# Patient Record
Sex: Male | Born: 1965 | Race: Asian | Hispanic: No | Marital: Single | Smoking: Never smoker
Health system: Southern US, Community
[De-identification: ages and names within clinical notes are randomized; demographics above are authoritative.]

## PROBLEM LIST (undated history)

## (undated) DIAGNOSIS — I10 Essential (primary) hypertension: Secondary | ICD-10-CM

---

## 2016-09-11 DIAGNOSIS — R600 Localized edema: Secondary | ICD-10-CM | POA: Insufficient documentation

## 2016-09-11 DIAGNOSIS — I10 Essential (primary) hypertension: Secondary | ICD-10-CM | POA: Insufficient documentation

## 2016-12-30 DIAGNOSIS — K5909 Other constipation: Secondary | ICD-10-CM | POA: Insufficient documentation

## 2016-12-30 DIAGNOSIS — R109 Unspecified abdominal pain: Secondary | ICD-10-CM | POA: Insufficient documentation

## 2016-12-30 DIAGNOSIS — R140 Abdominal distension (gaseous): Secondary | ICD-10-CM | POA: Insufficient documentation

## 2017-01-28 DIAGNOSIS — F630 Pathological gambling: Secondary | ICD-10-CM | POA: Insufficient documentation

## 2017-01-28 DIAGNOSIS — F331 Major depressive disorder, recurrent, moderate: Secondary | ICD-10-CM | POA: Insufficient documentation

## 2018-12-21 ENCOUNTER — Emergency Department (INDEPENDENT_AMBULATORY_CARE_PROVIDER_SITE_OTHER): Payer: Self-pay

## 2018-12-21 ENCOUNTER — Other Ambulatory Visit: Payer: Self-pay

## 2018-12-21 ENCOUNTER — Encounter: Payer: Self-pay | Admitting: *Deleted

## 2018-12-21 ENCOUNTER — Emergency Department
Admission: EM | Admit: 2018-12-21 | Discharge: 2018-12-21 | Disposition: A | Discharge status: Home / Self Care | Payer: Self-pay | Source: Home / Self Care | Attending: Family Medicine | Admitting: Family Medicine

## 2018-12-21 DIAGNOSIS — W19XXXA Unspecified fall, initial encounter: Secondary | ICD-10-CM

## 2018-12-21 DIAGNOSIS — S2241XA Multiple fractures of ribs, right side, initial encounter for closed fracture: Secondary | ICD-10-CM

## 2018-12-21 DIAGNOSIS — M545 Low back pain, unspecified: Secondary | ICD-10-CM | POA: Insufficient documentation

## 2018-12-21 DIAGNOSIS — S2242XA Multiple fractures of ribs, left side, initial encounter for closed fracture: Secondary | ICD-10-CM

## 2018-12-21 DIAGNOSIS — G4700 Insomnia, unspecified: Secondary | ICD-10-CM | POA: Insufficient documentation

## 2018-12-21 HISTORY — DX: Essential (primary) hypertension: I10

## 2018-12-21 MED ORDER — HYDROCODONE-ACETAMINOPHEN 5-325 MG PO TABS: ORAL_TABLET | ORAL | 0 refills | Status: AC

## 2018-12-21 NOTE — ED Provider Notes (Signed)
[facility]    CSN: [id number] Arrival date & time: 12/21/18  1341     History   Chief Complaint Chief Complaint  Patient presents with   Rib Injury    HPI Gabriel Weber is a 53 y.o. male.   Three days ago while at work, patient lost his balance and fell hitting his left lateral chest on the edge of a barrel.  He has had persistent left chest pain with movement and inspiration but denies shortness of breath.  He has been wearing an elastic chest band with mild improvement in his discomfort.  The history is provided by the patient.  Chest Pain  Pain location:  L chest Pain radiates to:  Does not radiate Pain severity:  Moderate Onset quality:  Sudden Duration:  3 days Timing:  Constant Progression:  Unchanged Chronicity:  New Context: breathing, lifting, movement, raising an arm, at rest and trauma   Relieved by:  Nothing Worsened by:  Certain positions, coughing, deep breathing and movement Ineffective treatments: Ibuprofen and Tylenol. Associated symptoms: no abdominal pain, no back pain, no cough, no diaphoresis, no dizziness, no dysphagia, no fatigue, no fever, no headache, no lower extremity edema, no nausea, no near-syncope, no numbness, no orthopnea, no palpitations, no PND, no shortness of breath, no syncope, no vomiting and no weakness     Past Medical History:  Diagnosis Date   Hypertension     Patient Active Problem List   Diagnosis Date Noted   Insomnia 12/21/2018   Low back pain 12/21/2018   Gambling disorder, moderate 01/28/2017   Moderate recurrent major depression (HCC) 01/28/2017   Abdominal bloating 12/30/2016   Abdominal pain 12/30/2016   Other constipation 12/30/2016   HTN, goal below 130/80 09/11/2016   Pedal edema 09/11/2016    History reviewed. No pertinent surgical history.     Home Medications    Prior to Admission medications   Medication Sig Start Date End Date Taking? Authorizing Provider  LORazepam (ATIVAN)  1 MG tablet TAKE ONE TABLET BY MOUTH TWICE A DAY AS NEEDED FOR ANXIETY 12/01/17  Yes [provider]  losartan-hydrochlorothiazide (HYZAAR) 50-12.5 MG tablet Take by mouth. 10/27/18 10/27/19 Yes [provider]  zolpidem (AMBIEN) 10 MG tablet TAKE ONE TABLET BY MOUTH EVERY NIGHT AT BEDTIME AS NEEDED FOR SLEEP 10/27/18  Yes [provider]  HYDROcodone-acetaminophen (NORCO/VICODIN) 5-325 MG tablet Take one by mouth at bedtime as needed for pain.  May repeat 4 to 6 hours later PRN 12/21/18   Lattie HawBeese, Kaidin Boehle A, MD    Family History Family History  Problem Relation Age of Onset   Hypertension Father    Diabetes Father     Social History Social History   Tobacco Use   Smoking status: Never Smoker   Smokeless tobacco: Never Used  Substance Use Topics   Alcohol use: Never    Frequency: Never   Drug use: Never     Allergies   Patient has no known allergies.   Review of Systems Review of Systems  Constitutional: Negative for diaphoresis, fatigue and fever.  HENT: Negative for trouble swallowing.   Respiratory: Negative for cough and shortness of breath.   Cardiovascular: Positive for chest pain. Negative for palpitations, orthopnea, syncope, PND and near-syncope.  Gastrointestinal: Negative for abdominal pain, nausea and vomiting.  Musculoskeletal: Negative for back pain.  Neurological: Negative for dizziness, weakness, numbness and headaches.  All other systems reviewed and are negative.    Physical Exam Triage Vital Signs ED  Triage Vitals  Enc Vitals Group     BP 12/21/18 1414 (!) 167/101     Pulse Rate 12/21/18 1414 72     Resp 12/21/18 1414 18     Temp 12/21/18 1414 (!) 97.4 F (36.3 C)     Temp Source 12/21/18 1414 Oral     SpO2 12/21/18 1414 100 %     Weight 12/21/18 1418 171 lb (77.6 kg)     Height 12/21/18 1418 6' (1.829 m)     Head Circumference --      Peak Flow --      Pain Score 12/21/18 1416 8     Pain Loc --      Pain Edu? --       Excl. in GC? --    No data found.  Updated Vital Signs BP (!) 167/101 (BP Location: Right Arm)    Pulse 72    Temp (!) 97.4 F (36.3 C) (Oral)    Resp 18    Ht 6' (1.829 m)    Wt 77.6 kg    SpO2 100%    BMI 23.19 kg/m   Visual Acuity Right Eye Distance:   Left Eye Distance:   Bilateral Distance:    Right Eye Near:   Left Eye Near:    Bilateral Near:     Physical Exam Vitals signs and nursing note reviewed.  Constitutional:      General: He is not in acute distress. HENT:     Head: Normocephalic.     Right Ear: External ear normal.     Left Ear: External ear normal.     Nose: Nose normal.     Mouth/Throat:     Pharynx: Oropharynx is clear.  Eyes:     Conjunctiva/sclera: Conjunctivae normal.     Pupils: Pupils are equal, round, and reactive to light.  Neck:     Musculoskeletal: Normal range of motion.  Cardiovascular:     Rate and Rhythm: Normal rate. Rhythm irregular.     Heart sounds: Normal heart sounds.  Pulmonary:     Effort: Pulmonary effort is normal.     Breath sounds: Normal breath sounds.  Chest:       Comments: Left anterior/lateral/inferior rib tenderness to palpation  as noted on diagram.  Abdominal:     General: Abdomen is flat. There is no distension.     Tenderness: There is no abdominal tenderness.  Musculoskeletal:        General: No swelling.  Skin:    General: Skin is warm and dry.  Neurological:     General: No focal deficit present.     Mental Status: He is alert.      UC Treatments / Results  Labs (all labs ordered are listed, but only abnormal results are displayed) Labs Reviewed - No data to display  EKG None  Radiology Dg Ribs Unilateral W/chest Left  Result Date: 12/21/2018 CLINICAL DATA:  Left rib pain following a fall. EXAM: LEFT RIBS AND CHEST - 3+ VIEW COMPARISON:  None. FINDINGS: Normal sized heart. Clear lungs. Minimally displaced left lateral 9th rib fracture and probable minimally displaced left lateral 8th  rib fracture. No pneumothorax. IMPRESSION: Left 8th and 9th rib fractures without pneumothorax. Electronically Signed   By: Beckie Salts M.D.   On: 12/21/2018 14:45    Procedures Procedures (including critical care time)  Medications Ordered in UC Medications - No data to display  Initial Impression / Assessment and Plan / UC Course  I have reviewed the triage vital signs and the nursing notes.  Pertinent labs & imaging results that were available during my care of the patient were reviewed by me and considered in my medical decision making (see chart for details).    Rx given for Lortab at bedtime (#10, no refill). Controlled Substance Prescriptions I have consulted the [place] Controlled Substances Registry for this patient, and feel the risk/benefit ratio today is favorable for proceeding with this prescription for a controlled substance.  Recommend using incentive spirometer. Followup with Dr. Rodney Langton or Dr. Clement[facility] ([facility]) if not improving about 3 weeks.   Final Clinical Impressions(s) / UC Diagnoses   Final diagnoses:  Closed fracture of multiple ribs of left side, initial encounter     Discharge Instructions     Apply ice pack for 20 to 30 minutes, 3 to 4 times daily  Continue until pain and swelling decrease.  May take Tylenol as needed for pain. May wear rib belt sparingly. If symptoms become significantly worse during the night or over the weekend, proceed to the local emergency room.     ED Prescriptions    Medication Sig Dispense Auth. Provider   HYDROcodone-acetaminophen (NORCO/VICODIN) 5-325 MG tablet Take one by mouth at bedtime as needed for pain.  May repeat 4 to 6 hours later PRN 10 tablet Cathren Harsh Tera Mater, MD        Lattie Haw, MD 12/22/18 [id number]

## 2018-12-21 NOTE — Discharge Instructions (Addendum)
Apply ice pack for 20 to 30 minutes, 3 to 4 times daily  Continue until pain and swelling decrease.  May take Tylenol as needed for pain. May wear rib belt sparingly. If symptoms become significantly worse during the night or over the weekend, proceed to the local emergency room.

## 2018-12-21 NOTE — ED Triage Notes (Signed)
Pt c/o LT rib pain post fall at work 2 days ago. He has taken IBF, but none today.

## 2020-06-09 IMAGING — DX LEFT RIBS AND CHEST - 3+ VIEW
3 series · 3 of 3 positions shown · non-contrast
Comparison: None.

CLINICAL DATA: Left rib pain following a fall.

EXAM:
LEFT RIBS AND CHEST - 3+ VIEW

[chest pa]
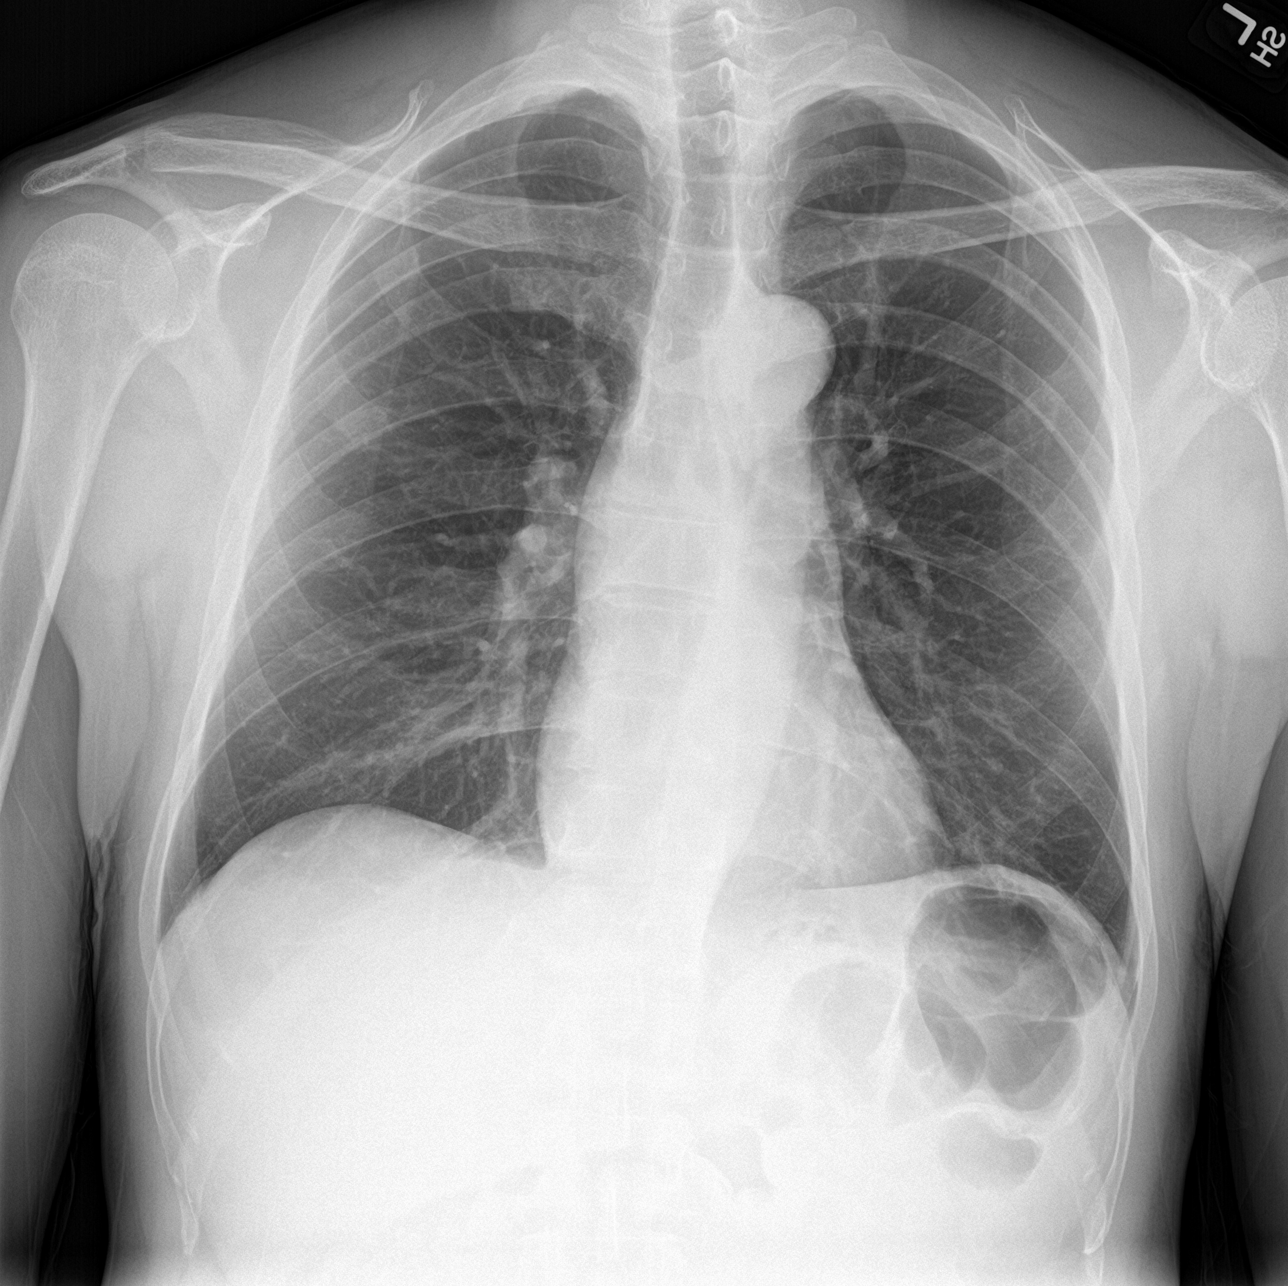

[rib pa]
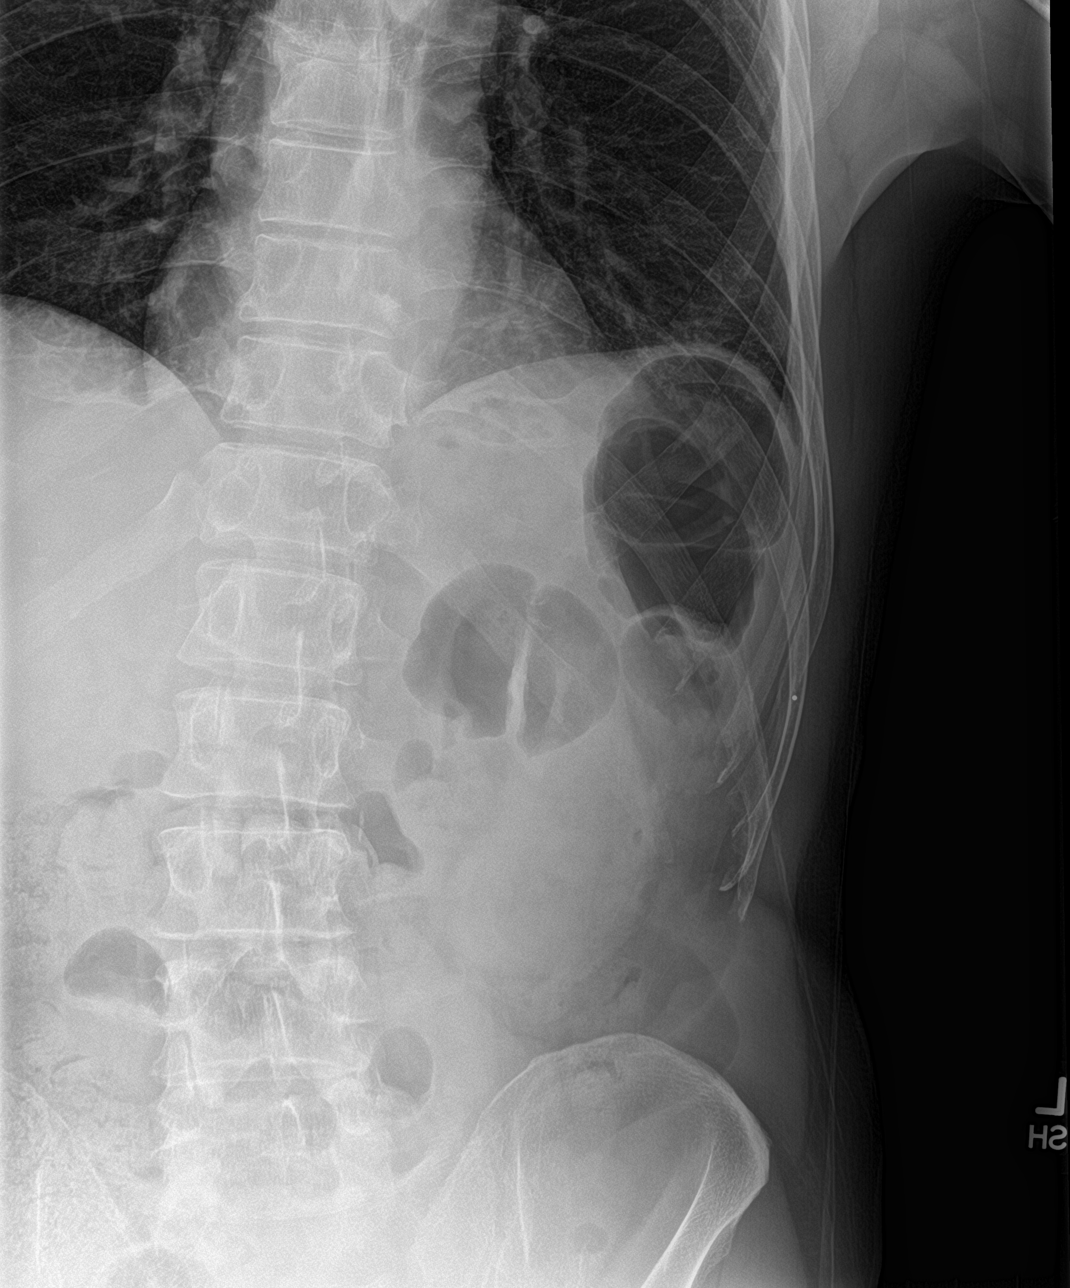

[rib pa obl]
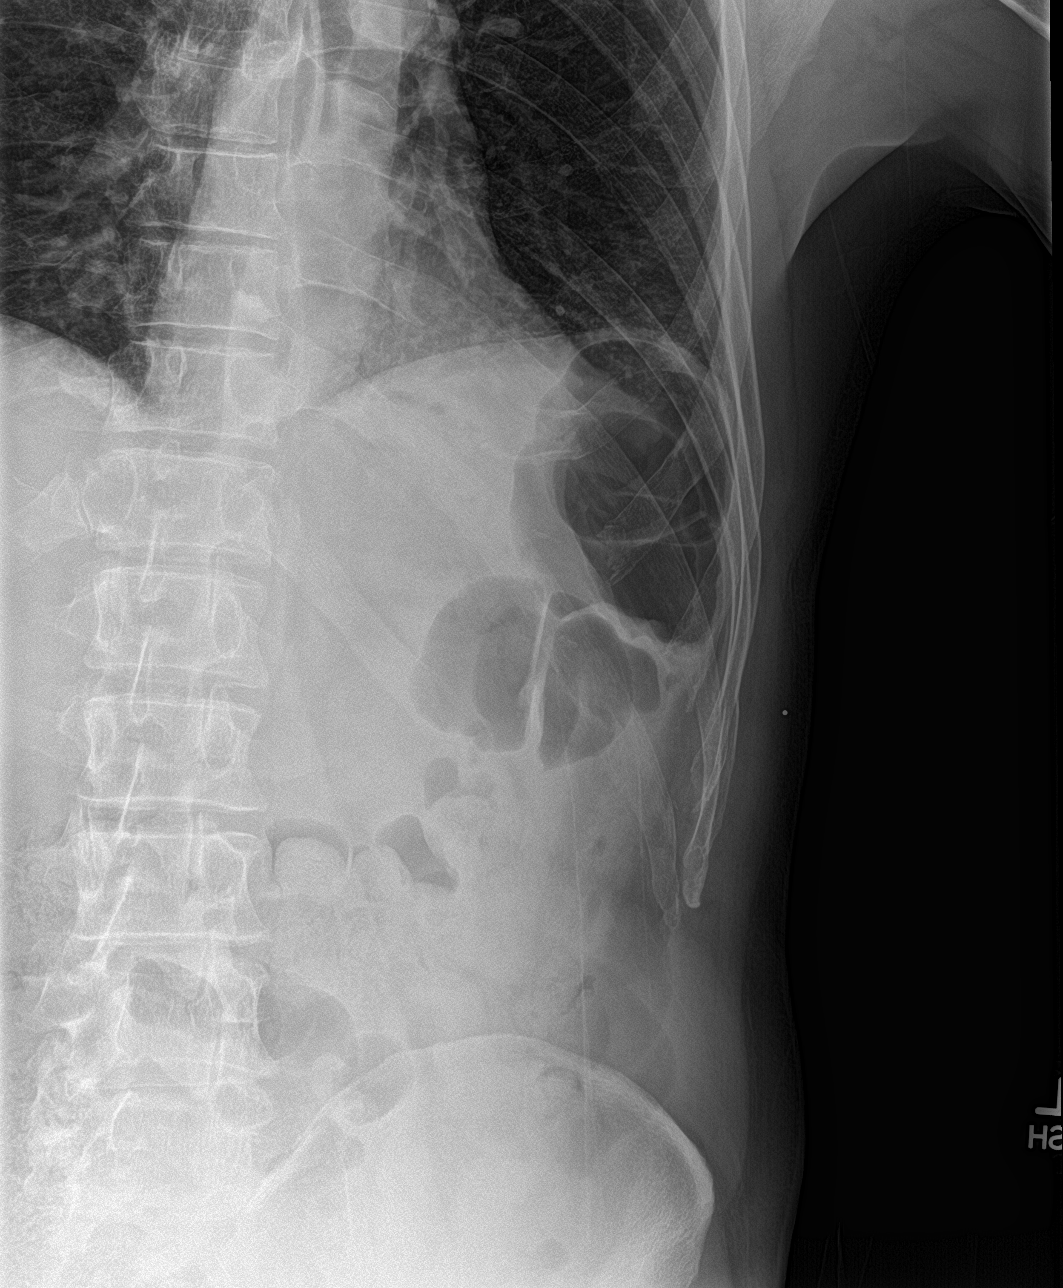

[3 of 3 positions shown; findings below may reference images not displayed]

FINDINGS: Normal sized heart. Clear lungs. Minimally displaced left lateral
9th rib fracture and probable minimally displaced left lateral 8th
rib fracture. No pneumothorax.
IMPRESSION: Left 8th and 9th rib fractures without pneumothorax.
